# Patient Record
Sex: Male | Born: 1955
Health system: Midwestern US, Community
[De-identification: ages and names within clinical notes are randomized; demographics above are authoritative.]

---

## 2007-12-31 ENCOUNTER — Ambulatory Visit: Payer: Self-pay | Admitting: Diagnostic Radiology

## 2007-12-31 ENCOUNTER — Emergency Department (HOSPITAL_BASED_OUTPATIENT_CLINIC_OR_DEPARTMENT_OTHER): Admission: EM | Admit: 2007-12-31 | Discharge: 2007-12-31 | Payer: Self-pay | Admitting: Emergency Medicine

## 2009-11-28 ENCOUNTER — Encounter: Admission: RE | Admit: 2009-11-28 | Discharge: 2009-11-28 | Payer: Self-pay | Admitting: *Deleted
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
## 2015-02-14 ENCOUNTER — Encounter: Payer: Self-pay | Admitting: *Deleted

## 2015-02-14 DIAGNOSIS — K8050 Calculus of bile duct without cholangitis or cholecystitis without obstruction: Secondary | ICD-10-CM

## 2015-02-16 ENCOUNTER — Telehealth: Payer: Self-pay | Admitting: *Deleted

## 2015-02-16 ENCOUNTER — Encounter: Payer: Self-pay | Admitting: *Deleted

## 2015-02-16 NOTE — Congregational Nurse Program (Unsigned)
Congregational Nurse Program Note  Date of Encounter: 02/14/2015  Past Medical History: Gall bladder stone  Encounter Details:  pt. Left messages for questions due to acute pain on Jan. 16,2017 Call pt, said that been primary MD, and will have surgery on Thursday 02/14/2015 Visit [facility]. 12noon to 1400. ERCP done for common bile duct stone removal by Dr. Marcos Eke Pt discharged from [facility]. Need removal of gall bladder few month later. Will follow up after procedure done. Pt. Went home without any problems.

## 2015-02-16 NOTE — Telephone Encounter (Signed)
D; f/p with pt.  No pain except sore throat. Feel fine per pt.

## 2017-11-02 ENCOUNTER — Encounter: Payer: Self-pay | Admitting: *Deleted

## 2019-10-01 ENCOUNTER — Encounter: Payer: Self-pay | Admitting: *Deleted

## 2020-07-18 ENCOUNTER — Encounter: Payer: Self-pay | Admitting: *Deleted

## 2020-07-18 NOTE — Congregational Nurse Program (Signed)
  Dept: [phone]   Congregational Nurse Program Note  Date of Encounter: 07/18/2020  Past Medical History: No past medical history on file.  Encounter Details:  CNP Questionnaire - 07/18/20 1239       Questionnaire   Do you give verbal consent to treat you today? Yes    Visit Setting Home    Location Patient Served At Not Applicable   home visit   Patient Status Immigrant    Medical Provider Yes    Insurance Private Insurance    Intervention Spiritual Care    Housing/Utilities --   NA   Transportation --   NA   Interpersonal Safety --   NA   Food --   NA   Medication --   NA   Referrals --   NA   Screening Referrals --   NA   ED Visit Averted --   NA   Life-Saving Intervention Made --   NA             Home visit for spiritual care. Been quarantine for COVID positive, wife is traveling outside of [place], pt. Stay home by himself for a week. Felt better except dry cough.  Will come back church coming Sunday.

## 2021-09-05 NOTE — Telephone Encounter (Signed)
Patient made an online request to see a primary care provider . If patient calls please assist.
